# Patient Record
Sex: Female | Born: 1955 | Hispanic: Yes | Marital: Single | Smoking: Former smoker
Health system: Southern US, Community
[De-identification: ages and names within clinical notes are randomized; demographics above are authoritative.]

---

## 2017-11-17 DIAGNOSIS — J4540 Moderate persistent asthma, uncomplicated: Secondary | ICD-10-CM

## 2017-11-17 HISTORY — DX: Moderate persistent asthma, uncomplicated: J45.40

## 2017-12-21 ENCOUNTER — Other Ambulatory Visit: Payer: Self-pay | Admitting: Nurse Practitioner

## 2017-12-21 DIAGNOSIS — Z1231 Encounter for screening mammogram for malignant neoplasm of breast: Secondary | ICD-10-CM

## 2019-03-07 DIAGNOSIS — E559 Vitamin D deficiency, unspecified: Secondary | ICD-10-CM

## 2019-03-07 HISTORY — DX: Vitamin D deficiency, unspecified: E55.9

## 2022-03-30 ENCOUNTER — Emergency Department (HOSPITAL_COMMUNITY): Payer: BLUE CROSS/BLUE SHIELD

## 2022-03-30 ENCOUNTER — Other Ambulatory Visit: Payer: Self-pay

## 2022-03-30 ENCOUNTER — Encounter (HOSPITAL_COMMUNITY): Payer: Self-pay

## 2022-03-30 ENCOUNTER — Observation Stay (HOSPITAL_COMMUNITY): Payer: BLUE CROSS/BLUE SHIELD

## 2022-03-30 ENCOUNTER — Observation Stay (HOSPITAL_COMMUNITY)
Admission: EM | Admit: 2022-03-30 | Discharge: 2022-03-31 | Disposition: A | Discharge status: Home / Self Care | Payer: BLUE CROSS/BLUE SHIELD | Attending: Internal Medicine | Admitting: Internal Medicine

## 2022-03-30 DIAGNOSIS — K922 Gastrointestinal hemorrhage, unspecified: Secondary | ICD-10-CM | POA: Diagnosis not present

## 2022-03-30 DIAGNOSIS — J4540 Moderate persistent asthma, uncomplicated: Secondary | ICD-10-CM

## 2022-03-30 DIAGNOSIS — D62 Acute posthemorrhagic anemia: Secondary | ICD-10-CM | POA: Diagnosis not present

## 2022-03-30 DIAGNOSIS — K5731 Diverticulosis of large intestine without perforation or abscess with bleeding: Principal | ICD-10-CM | POA: Insufficient documentation

## 2022-03-30 DIAGNOSIS — Z87891 Personal history of nicotine dependence: Secondary | ICD-10-CM | POA: Diagnosis not present

## 2022-03-30 DIAGNOSIS — Z7984 Long term (current) use of oral hypoglycemic drugs: Secondary | ICD-10-CM | POA: Diagnosis not present

## 2022-03-30 DIAGNOSIS — Z794 Long term (current) use of insulin: Secondary | ICD-10-CM

## 2022-03-30 DIAGNOSIS — K648 Other hemorrhoids: Secondary | ICD-10-CM | POA: Diagnosis not present

## 2022-03-30 DIAGNOSIS — E1165 Type 2 diabetes mellitus with hyperglycemia: Secondary | ICD-10-CM

## 2022-03-30 DIAGNOSIS — Z79899 Other long term (current) drug therapy: Secondary | ICD-10-CM | POA: Insufficient documentation

## 2022-03-30 DIAGNOSIS — K921 Melena: Secondary | ICD-10-CM | POA: Diagnosis not present

## 2022-03-30 DIAGNOSIS — E782 Mixed hyperlipidemia: Secondary | ICD-10-CM | POA: Diagnosis not present

## 2022-03-30 DIAGNOSIS — E1169 Type 2 diabetes mellitus with other specified complication: Secondary | ICD-10-CM | POA: Diagnosis not present

## 2022-03-30 DIAGNOSIS — I10 Essential (primary) hypertension: Secondary | ICD-10-CM | POA: Diagnosis present

## 2022-03-30 DIAGNOSIS — K625 Hemorrhage of anus and rectum: Secondary | ICD-10-CM | POA: Diagnosis present

## 2022-03-30 HISTORY — DX: Essential (primary) hypertension: I10

## 2022-03-30 HISTORY — DX: Type 2 diabetes mellitus with hyperglycemia: E11.65

## 2022-03-30 HISTORY — DX: Type 2 diabetes mellitus with other specified complication: E11.69

## 2022-03-30 HISTORY — DX: Mixed hyperlipidemia: E78.2

## 2022-03-30 LAB — COMPREHENSIVE METABOLIC PANEL
ALT: 23 U/L (ref 0–44)
AST: 20 U/L (ref 15–41)
Albumin: 4.4 g/dL (ref 3.5–5.0)
Alkaline Phosphatase: 52 U/L (ref 38–126)
Anion gap: 9 (ref 5–15)
BUN: 17 mg/dL (ref 8–23)
CO2: 26 mmol/L (ref 22–32)
Calcium: 9.4 mg/dL (ref 8.9–10.3)
Chloride: 106 mmol/L (ref 98–111)
Creatinine, Ser: 0.73 mg/dL (ref 0.44–1.00)
GFR, Estimated: >60 mL/min (ref >60)
Glucose, Bld: 134 mg/dL — ABNORMAL HIGH (ref 70–99)
Potassium: 3.9 mmol/L (ref 3.5–5.1)
Sodium: 141 mmol/L (ref 135–145)
Total Bilirubin: 0.3 mg/dL (ref 0.3–1.2)
Total Protein: 7.7 g/dL (ref 6.5–8.1)

## 2022-03-30 LAB — CBC WITH DIFFERENTIAL/PLATELET
Abs Immature Granulocytes: 0.02 10*3/uL (ref 0.00–0.07)
Basophils Absolute: 0 10*3/uL (ref 0.0–0.1)
Basophils Relative: 0 %
Eosinophils Absolute: 0.2 10*3/uL (ref 0.0–0.5)
Eosinophils Relative: 2 %
HCT: 45.8 % (ref 36.0–46.0)
Hemoglobin: 14.7 g/dL (ref 12.0–15.0)
Immature Granulocytes: 0 %
Lymphocytes Relative: 26 %
Lymphs Abs: 2.5 10*3/uL (ref 0.7–4.0)
MCH: 27.1 pg (ref 26.0–34.0)
MCHC: 32.1 g/dL (ref 30.0–36.0)
MCV: 84.5 fL (ref 80.0–100.0)
Monocytes Absolute: 0.6 10*3/uL (ref 0.1–1.0)
Monocytes Relative: 6 %
Neutro Abs: 6.4 10*3/uL (ref 1.7–7.7)
Neutrophils Relative %: 66 %
Platelets: 296 10*3/uL (ref 150–400)
RBC: 5.42 MIL/uL — ABNORMAL HIGH (ref 3.87–5.11)
RDW: 13.2 % (ref 11.5–15.5)
WBC: 9.8 10*3/uL (ref 4.0–10.5)
nRBC: 0 % (ref 0.0–0.2)

## 2022-03-30 LAB — CBC
HCT: 38.4 % (ref 36.0–46.0)
HCT: 37.7 % (ref 36.0–46.0)
HCT: 35.7 % — ABNORMAL LOW (ref 36.0–46.0)
Hemoglobin: 12.4 g/dL (ref 12.0–15.0)
Hemoglobin: 12 g/dL (ref 12.0–15.0)
Hemoglobin: 11.4 g/dL — ABNORMAL LOW (ref 12.0–15.0)
MCH: 27.2 pg (ref 26.0–34.0)
MCH: 27 pg (ref 26.0–34.0)
MCH: 27.1 pg (ref 26.0–34.0)
MCHC: 32.3 g/dL (ref 30.0–36.0)
MCHC: 31.8 g/dL (ref 30.0–36.0)
MCHC: 31.9 g/dL (ref 30.0–36.0)
MCV: 84.2 fL (ref 80.0–100.0)
MCV: 84.9 fL (ref 80.0–100.0)
MCV: 85 fL (ref 80.0–100.0)
Platelets: 283 10*3/uL (ref 150–400)
Platelets: 262 10*3/uL (ref 150–400)
Platelets: 235 10*3/uL (ref 150–400)
RBC: 4.56 MIL/uL (ref 3.87–5.11)
RBC: 4.44 MIL/uL (ref 3.87–5.11)
RBC: 4.2 MIL/uL (ref 3.87–5.11)
RDW: 13.3 % (ref 11.5–15.5)
RDW: 13.3 % (ref 11.5–15.5)
RDW: 13.3 % (ref 11.5–15.5)
WBC: 7.7 10*3/uL (ref 4.0–10.5)
WBC: 6.7 10*3/uL (ref 4.0–10.5)
WBC: 6.5 10*3/uL (ref 4.0–10.5)
nRBC: 0 % (ref 0.0–0.2)
nRBC: 0 % (ref 0.0–0.2)
nRBC: 0 % (ref 0.0–0.2)

## 2022-03-30 LAB — GLUCOSE, CAPILLARY
Glucose-Capillary: 121 mg/dL — ABNORMAL HIGH (ref 70–99)
Glucose-Capillary: 65 mg/dL — ABNORMAL LOW (ref 70–99)
Glucose-Capillary: 168 mg/dL — ABNORMAL HIGH (ref 70–99)

## 2022-03-30 LAB — CBG MONITORING, ED
Glucose-Capillary: 111 mg/dL — ABNORMAL HIGH (ref 70–99)
Glucose-Capillary: 164 mg/dL — ABNORMAL HIGH (ref 70–99)

## 2022-03-30 LAB — POC OCCULT BLOOD, ED: Fecal Occult Bld: POSITIVE — AB

## 2022-03-30 LAB — TYPE AND SCREEN
ABO/RH(D): AB POS
Antibody Screen: NEGATIVE

## 2022-03-30 LAB — PROTIME-INR
INR: 0.9 (ref 0.8–1.2)
Prothrombin Time: 12.2 seconds (ref 11.4–15.2)

## 2022-03-30 LAB — HEMOGLOBIN A1C
Hgb A1c MFr Bld: 8.7 % — ABNORMAL HIGH (ref 4.8–5.6)
Mean Plasma Glucose: 202.99 mg/dL

## 2022-03-30 LAB — HIV ANTIBODY (ROUTINE TESTING W REFLEX): HIV Screen 4th Generation wRfx: NONREACTIVE

## 2022-03-30 LAB — ABO/RH: ABO/RH(D): AB POS

## 2022-03-30 MED ORDER — INSULIN GLARGINE-YFGN 100 UNIT/ML ~~LOC~~ SOLN
40.0000 [IU] | Freq: Every day | SUBCUTANEOUS | Status: DC
  Filled 2022-03-30: qty 0.4

## 2022-03-30 MED ORDER — HYDRALAZINE HCL 20 MG/ML IJ SOLN: 10.0000 mg | Freq: Four times a day (QID) | INTRAMUSCULAR | Status: DC | PRN

## 2022-03-30 MED ORDER — PANTOPRAZOLE INFUSION (NEW) - SIMPLE MED
8.0000 mg/h | INTRAVENOUS | Status: DC
  Administered 2022-03-30: 8 mg/h via INTRAVENOUS
  Filled 2022-03-30: qty 80

## 2022-03-30 MED ORDER — PEG-KCL-NACL-NASULF-NA ASC-C 100 G PO SOLR
0.5000 | Freq: Once | ORAL | Status: AC
  Administered 2022-03-30: 100 g via ORAL
  Filled 2022-03-30 (×2): qty 1

## 2022-03-30 MED ORDER — ACETAMINOPHEN 325 MG PO TABS: 650.0000 mg | ORAL_TABLET | Freq: Four times a day (QID) | ORAL | Status: DC | PRN

## 2022-03-30 MED ORDER — INSULIN ASPART 100 UNIT/ML IJ SOLN
0.0000 [IU] | Freq: Three times a day (TID) | INTRAMUSCULAR | Status: DC
  Administered 2022-03-30 (×2): 3 [IU] via SUBCUTANEOUS
  Filled 2022-03-30: qty 0.15

## 2022-03-30 MED ORDER — ONDANSETRON HCL 4 MG PO TABS: 4.0000 mg | ORAL_TABLET | Freq: Four times a day (QID) | ORAL | Status: DC | PRN

## 2022-03-30 MED ORDER — PANTOPRAZOLE SODIUM 40 MG IV SOLR: 40.0000 mg | Freq: Two times a day (BID) | INTRAVENOUS | Status: DC

## 2022-03-30 MED ORDER — LACTATED RINGERS IV SOLN: INTRAVENOUS | Status: AC

## 2022-03-30 MED ORDER — PEG-KCL-NACL-NASULF-NA ASC-C 100 G PO SOLR: 1.0000 | Freq: Once | ORAL | Status: DC

## 2022-03-30 MED ORDER — ALBUTEROL SULFATE (2.5 MG/3ML) 0.083% IN NEBU
2.5000 mg | INHALATION_SOLUTION | RESPIRATORY_TRACT | Status: DC | PRN
  Administered 2022-03-31 (×2): 2.5 mg via RESPIRATORY_TRACT
  Filled 2022-03-30 (×2): qty 3

## 2022-03-30 MED ORDER — PANTOPRAZOLE 80MG IVPB - SIMPLE MED
80.0000 mg | Freq: Once | INTRAVENOUS | Status: AC
  Administered 2022-03-30: 80 mg via INTRAVENOUS
  Filled 2022-03-30: qty 80

## 2022-03-30 MED ORDER — ACETAMINOPHEN 650 MG RE SUPP: 650.0000 mg | Freq: Four times a day (QID) | RECTAL | Status: DC | PRN

## 2022-03-30 MED ORDER — SODIUM CHLORIDE (PF) 0.9 % IJ SOLN
INTRAMUSCULAR | Status: AC
  Filled 2022-03-30: qty 50

## 2022-03-30 MED ORDER — PEG-KCL-NACL-NASULF-NA ASC-C 100 G PO SOLR
0.5000 | Freq: Once | ORAL | Status: AC
  Administered 2022-03-31: 100 g via ORAL
  Filled 2022-03-30: qty 1

## 2022-03-30 MED ORDER — PANTOPRAZOLE SODIUM 40 MG IV SOLR
40.0000 mg | Freq: Every day | INTRAVENOUS | Status: DC
  Administered 2022-03-30: 40 mg via INTRAVENOUS
  Filled 2022-03-30: qty 10

## 2022-03-30 MED ORDER — ONDANSETRON HCL 4 MG/2ML IJ SOLN: 4.0000 mg | Freq: Four times a day (QID) | INTRAMUSCULAR | Status: DC | PRN

## 2022-03-30 MED ORDER — POLYETHYLENE GLYCOL 3350 17 G PO PACK: 17.0000 g | PACK | Freq: Every day | ORAL | Status: DC | PRN

## 2022-03-30 MED ORDER — IOHEXOL 300 MG/ML  SOLN: 100.0000 mL | Freq: Once | INTRAMUSCULAR | Status: DC | PRN

## 2022-03-30 MED ORDER — INSULIN DEGLUDEC 100 UNIT/ML ~~LOC~~ SOPN: 40.0000 [IU] | PEN_INJECTOR | Freq: Every day | SUBCUTANEOUS | Status: DC

## 2022-03-30 MED ORDER — ROSUVASTATIN CALCIUM 20 MG PO TABS
40.0000 mg | ORAL_TABLET | Freq: Every day | ORAL | Status: DC
  Administered 2022-03-30 – 2022-03-31 (×2): 40 mg via ORAL
  Filled 2022-03-30 (×2): qty 2

## 2022-03-30 MED ORDER — SODIUM CHLORIDE 0.9% FLUSH
3.0000 mL | Freq: Two times a day (BID) | INTRAVENOUS | Status: DC
  Administered 2022-03-31: 3 mL via INTRAVENOUS

## 2022-03-30 NOTE — ED Notes (Signed)
BGS 111   no insulin given  patient states no blood in stool but there is a small amount of blood when she wipes

## 2022-03-30 NOTE — Assessment & Plan Note (Addendum)
   Patient presenting with approximately 5-6 episodes of bloody stool prompting her presentation  No associated pain suggesting a diverticular bleed  Alternatively this could simply be bleeding internal hemorrhoid considering the patient began to bleed shortly after inserting a suppository  Hemodynamically stable, hemoglobin currently stable  Performing serial CBCs  Gentle intravenous hydration  Obtaining gastroenterology consultation to decide on whether endoscopic work-up (after prep could possibly be done 6/22) versus CTA of the abdomen (with steroid prep) would be the next diagnostic step  ER provider initiated an intravenous PPI although I think it is unlikely that this is upper in origin.

## 2022-03-30 NOTE — ED Triage Notes (Signed)
Pt reports with rectal bleeding since 8 pm last night. Pt states that she was initially constipated, took a suppository and was then having the bleeding last night.

## 2022-03-30 NOTE — Consult Note (Signed)
Referring Provider: [facility] Primary Care Physician:  Bernita Buffy Primary Gastroenterologist:  Gentry Fitz  Reason for Consultation: GI bleed  HPI: Susan Yates is a 66 y.o. female medical history significant for hypertension, asthma, diabetes, presents for evaluation of GI bleed.  Patient states yesterday she had multiple episodes of rectal bleeding, appeared as.  Blood with red blood clots.  States her last episode of rectal bleeding was at 5 AM this morning.  Has had no further bleeding since. Denies melena. Denies nausea/vomiting. No previous history of rectal bleeding. Denies weight loss. Denies family history of colon cancer.  Reports chronic epigastric pain, worse with eating. This has been evaluated by primary GI with EGD (unable to view report) but patient reports EGD was normal.  Last colonoscopy 01/2018 done with [facility], colonoscopy report unavailable.  Per patient report 1 polyp was found and a recall of 5 years  EGD 05/2020 done for LUQ pain and history of H. Pylori.  Serum H. pylori 03/25/2020: Positive (treated with amoxicillin, Biaxin).  Unable to review EGD report                                                                                                                           Past Medical History:  Diagnosis Date   Essential hypertension 03/30/2022   Mixed diabetic hyperlipidemia associated with type 2 diabetes mellitus (HCC) 03/30/2022   Moderate persistent asthma without complication 11/17/2017   Last Assessment & Plan:  Formatting of this note might be different from the original. Well controlled on current treatment. No changes.   Uncontrolled type 2 diabetes mellitus with hyperglycemia, with long-term current use of insulin (HCC) 03/30/2022   Vitamin D deficiency 03/07/2019    History reviewed. No pertinent surgical history.  Prior to Admission medications   Medication Sig Start Date End Date Taking? Authorizing Provider  FARXIGA 10 MG TABS tablet Take 10  mg by mouth daily. 02/02/22  Yes [provider]  glipiZIDE (GLUCOTROL) 10 MG tablet Take 10 mg by mouth at bedtime. 02/02/22  Yes [provider]  insulin aspart (NOVOLOG) 100 UNIT/ML injection Inject 10 Units into the skin 3 (three) times daily before meals.   Yes [provider]  Insulin Degludec (TRESIBA FLEXTOUCH [place]) Inject 40 Units into the skin at bedtime.   Yes [provider]  rosuvastatin (CRESTOR) 40 MG tablet Take 40 mg by mouth daily. 02/02/22  Yes [provider]  Semaglutide, 2 MG/DOSE, (OZEMPIC, 2 MG/DOSE,) 8 MG/3ML SOPN Inject 2 mg into the skin once a week.   Yes [provider]  terbinafine (LAMISIL) 250 MG tablet Take 250 mg by mouth daily. 02/01/22  Yes [provider]  VENTOLIN HFA 108 (90 Base) MCG/ACT inhaler Inhale 2 puffs into the lungs every 6 (six) hours as needed for shortness of breath or wheezing. 02/02/22  Yes [provider]  Vitamin D, Ergocalciferol, (DRISDOL) 1.25 MG (50000 UNIT) CAPS capsule Take 50,000 Units by mouth once  a week. 02/02/22  Yes [provider]    Scheduled Meds:  insulin aspart  0-15 Units Subcutaneous TID AC & HS   insulin glargine-yfgn  40 Units Subcutaneous QHS   [START ON 04/02/2022] pantoprazole  40 mg Intravenous Q12H   rosuvastatin  40 mg Oral Daily   sodium chloride (PF)       sodium chloride flush  3 mL Intravenous Q12H   Continuous Infusions:  lactated ringers 100 mL/hr at 03/30/22 0649   PRN Meds:.acetaminophen **OR** acetaminophen, albuterol, ondansetron **OR** ondansetron (ZOFRAN) IV, polyethylene glycol, sodium chloride (PF)  Allergies as of 03/30/2022 - Review Complete 03/30/2022  Allergen Reaction Noted   Iohexol Hives and Itching 03/30/2022    Family History  Problem Relation Age of Onset   Heart disease Neg Hx     Social History   Socioeconomic History   Marital status: Single    Spouse name: Not on file   Number of children: Not on  file   Years of education: Not on file   Highest education level: Not on file  Occupational History   Not on file  Tobacco Use   Smoking status: Former    Types: Cigarettes   Smokeless tobacco: Never  Substance and Sexual Activity   Alcohol use: Not on file   Drug use: Not on file   Sexual activity: Not on file  Other Topics Concern   Not on file  Social History Narrative   Not on file   Social Determinants of Health   Financial Resource Strain: Not on file  Food Insecurity: Not on file  Transportation Needs: Not on file  Physical Activity: Not on file  Stress: Not on file  Social Connections: Not on file  Intimate Partner Violence: Not on file    Review of Systems: Review of Systems  Constitutional:  Negative for chills, fever and weight loss.  HENT:  Negative for hearing loss and tinnitus.   Eyes:  Negative for blurred vision and double vision.  Respiratory:  Negative for cough and hemoptysis.   Cardiovascular:  Negative for chest pain and palpitations.  Gastrointestinal:  Positive for abdominal pain and blood in stool. Negative for constipation, diarrhea, heartburn, melena, nausea and vomiting.  Genitourinary:  Negative for dysuria and urgency.  Musculoskeletal:  Negative for myalgias and neck pain.  Skin:  Negative for itching and rash.  Neurological:  Negative for seizures and loss of consciousness.  Psychiatric/Behavioral:  Negative for depression and suicidal ideas.      Physical Exam:Physical Exam Constitutional:      Appearance: Normal appearance. She is normal weight.  HENT:     Head: Normocephalic and atraumatic.     Nose: Nose normal. No congestion.     Mouth/Throat:     Mouth: Mucous membranes are moist.     Pharynx: Oropharynx is clear.  Eyes:     Extraocular Movements: Extraocular movements intact.     Conjunctiva/sclera: Conjunctivae normal.  Cardiovascular:     Rate and Rhythm: Normal rate and regular rhythm.  Pulmonary:     Effort: Pulmonary  effort is normal. No respiratory distress.  Abdominal:     General: Abdomen is flat. Bowel sounds are normal. There is no distension.     Palpations: Abdomen is soft. There is no mass.     Tenderness: There is no abdominal tenderness. There is no guarding or rebound.     Hernia: No hernia is present.  Musculoskeletal:        General:  No swelling. Normal range of motion.     Cervical back: Normal range of motion and neck supple.  Skin:    General: Skin is warm and dry.  Neurological:     General: No focal deficit present.     Mental Status: She is oriented to person, place, and time.  Psychiatric:        Mood and Affect: Mood normal.        Behavior: Behavior normal.        Thought Content: Thought content normal.        Judgment: Judgment normal.     Vital signs: Vitals:   03/30/22 0630 03/30/22 0700  BP: 114/78 115/75  Pulse: 73 77  Resp: 18 20  Temp:    SpO2: 97% 98%        GI:  Lab Results: Recent Labs    03/30/22 0328  WBC 9.8  HGB 14.7  HCT 45.8  PLT 296   BMET Recent Labs    03/30/22 0328  NA 141  K 3.9  CL 106  CO2 26  GLUCOSE 134*  BUN 17  CREATININE 0.73  CALCIUM 9.4   LFT Recent Labs    03/30/22 0328  PROT 7.7  ALBUMIN 4.4  AST 20  ALT 23  ALKPHOS 52  BILITOT 0.3   PT/INR Recent Labs    03/30/22 0328  LABPROT 12.2  INR 0.9     Studies/Results: No results found.  Impression: Lower GI bleed -Hgb 14.7 -BUN 17, creatinine 0.73 -Positive fecal occult  Plan: Plan for Colonoscopy tomorrow. I thoroughly discussed the procedures to include nature, alternatives, benefits, and risks including but not limited to bleeding, perforation, infection, anesthesia/cardiac and pulmonary complications. Patient provides understanding and gave verbal consent to proceed. Continue Protonix 40 mg IV BID. Movi prep. First bottle 10pm-12am, second bottle 4am to 8am with NPO 4 hrs prior to procedure. Continue daily CBC with transfusion as needed  to maintain Hgb >7.  [facility] will follow.       LOS: 0 days   Keyarra Rendall M Castle Lamons  PA-C 03/30/2022, 8:22 AM  Contact #  [phone]  

## 2022-03-30 NOTE — Assessment & Plan Note (Addendum)
.   Patient been placed on Accu-Cheks before every meal and nightly with sliding scale insulin . Due to tenuous oral intake we will hold off on basal insulin therapy for now . Holding home regimen of hypoglycemics . Hemoglobin A1C ordered

## 2022-03-30 NOTE — H&P (View-Only) (Signed)
Referring Provider: [facility] Primary Care Physician:  Bernita Buffy Primary Gastroenterologist:  Gentry Fitz  Reason for Consultation: GI bleed  HPI: Susan Yates is a 66 y.o. female medical history significant for hypertension, asthma, diabetes, presents for evaluation of GI bleed.  Patient states yesterday she had multiple episodes of rectal bleeding, appeared as.  Blood with red blood clots.  States her last episode of rectal bleeding was at 5 AM this morning.  Has had no further bleeding since. Denies melena. Denies nausea/vomiting. No previous history of rectal bleeding. Denies weight loss. Denies family history of colon cancer.  Reports chronic epigastric pain, worse with eating. This has been evaluated by primary GI with EGD (unable to view report) but patient reports EGD was normal.  Last colonoscopy 01/2018 done with [facility], colonoscopy report unavailable.  Per patient report 1 polyp was found and a recall of 5 years  EGD 05/2020 done for LUQ pain and history of H. Pylori.  Serum H. pylori 03/25/2020: Positive (treated with amoxicillin, Biaxin).  Unable to review EGD report                                                                                                                           Past Medical History:  Diagnosis Date   Essential hypertension 03/30/2022   Mixed diabetic hyperlipidemia associated with type 2 diabetes mellitus (HCC) 03/30/2022   Moderate persistent asthma without complication 11/17/2017   Last Assessment & Plan:  Formatting of this note might be different from the original. Well controlled on current treatment. No changes.   Uncontrolled type 2 diabetes mellitus with hyperglycemia, with long-term current use of insulin (HCC) 03/30/2022   Vitamin D deficiency 03/07/2019    History reviewed. No pertinent surgical history.  Prior to Admission medications   Medication Sig Start Date End Date Taking? Authorizing Provider  FARXIGA 10 MG TABS tablet Take 10  mg by mouth daily. 02/02/22  Yes [provider]  glipiZIDE (GLUCOTROL) 10 MG tablet Take 10 mg by mouth at bedtime. 02/02/22  Yes [provider]  insulin aspart (NOVOLOG) 100 UNIT/ML injection Inject 10 Units into the skin 3 (three) times daily before meals.   Yes [provider]  Insulin Degludec (TRESIBA FLEXTOUCH [place]) Inject 40 Units into the skin at bedtime.   Yes [provider]  rosuvastatin (CRESTOR) 40 MG tablet Take 40 mg by mouth daily. 02/02/22  Yes [provider]  Semaglutide, 2 MG/DOSE, (OZEMPIC, 2 MG/DOSE,) 8 MG/3ML SOPN Inject 2 mg into the skin once a week.   Yes [provider]  terbinafine (LAMISIL) 250 MG tablet Take 250 mg by mouth daily. 02/01/22  Yes [provider]  VENTOLIN HFA 108 (90 Base) MCG/ACT inhaler Inhale 2 puffs into the lungs every 6 (six) hours as needed for shortness of breath or wheezing. 02/02/22  Yes [provider]  Vitamin D, Ergocalciferol, (DRISDOL) 1.25 MG (50000 UNIT) CAPS capsule Take 50,000 Units by mouth once  a week. 02/02/22  Yes [provider]    Scheduled Meds:  insulin aspart  0-15 Units Subcutaneous TID AC & HS   insulin glargine-yfgn  40 Units Subcutaneous QHS   [START ON 04/02/2022] pantoprazole  40 mg Intravenous Q12H   rosuvastatin  40 mg Oral Daily   sodium chloride (PF)       sodium chloride flush  3 mL Intravenous Q12H   Continuous Infusions:  lactated ringers 100 mL/hr at 03/30/22 0649   PRN Meds:.acetaminophen **OR** acetaminophen, albuterol, ondansetron **OR** ondansetron (ZOFRAN) IV, polyethylene glycol, sodium chloride (PF)  Allergies as of 03/30/2022 - Review Complete 03/30/2022  Allergen Reaction Noted   Iohexol Hives and Itching 03/30/2022    Family History  Problem Relation Age of Onset   Heart disease Neg Hx     Social History   Socioeconomic History   Marital status: Single    Spouse name: Not on file   Number of children: Not on  file   Years of education: Not on file   Highest education level: Not on file  Occupational History   Not on file  Tobacco Use   Smoking status: Former    Types: Cigarettes   Smokeless tobacco: Never  Substance and Sexual Activity   Alcohol use: Not on file   Drug use: Not on file   Sexual activity: Not on file  Other Topics Concern   Not on file  Social History Narrative   Not on file   Social Determinants of Health   Financial Resource Strain: Not on file  Food Insecurity: Not on file  Transportation Needs: Not on file  Physical Activity: Not on file  Stress: Not on file  Social Connections: Not on file  Intimate Partner Violence: Not on file    Review of Systems: Review of Systems  Constitutional:  Negative for chills, fever and weight loss.  HENT:  Negative for hearing loss and tinnitus.   Eyes:  Negative for blurred vision and double vision.  Respiratory:  Negative for cough and hemoptysis.   Cardiovascular:  Negative for chest pain and palpitations.  Gastrointestinal:  Positive for abdominal pain and blood in stool. Negative for constipation, diarrhea, heartburn, melena, nausea and vomiting.  Genitourinary:  Negative for dysuria and urgency.  Musculoskeletal:  Negative for myalgias and neck pain.  Skin:  Negative for itching and rash.  Neurological:  Negative for seizures and loss of consciousness.  Psychiatric/Behavioral:  Negative for depression and suicidal ideas.      Physical Exam:Physical Exam Constitutional:      Appearance: Normal appearance. She is normal weight.  HENT:     Head: Normocephalic and atraumatic.     Nose: Nose normal. No congestion.     Mouth/Throat:     Mouth: Mucous membranes are moist.     Pharynx: Oropharynx is clear.  Eyes:     Extraocular Movements: Extraocular movements intact.     Conjunctiva/sclera: Conjunctivae normal.  Cardiovascular:     Rate and Rhythm: Normal rate and regular rhythm.  Pulmonary:     Effort: Pulmonary  effort is normal. No respiratory distress.  Abdominal:     General: Abdomen is flat. Bowel sounds are normal. There is no distension.     Palpations: Abdomen is soft. There is no mass.     Tenderness: There is no abdominal tenderness. There is no guarding or rebound.     Hernia: No hernia is present.  Musculoskeletal:        General:  No swelling. Normal range of motion.     Cervical back: Normal range of motion and neck supple.  Skin:    General: Skin is warm and dry.  Neurological:     General: No focal deficit present.     Mental Status: She is oriented to person, place, and time.  Psychiatric:        Mood and Affect: Mood normal.        Behavior: Behavior normal.        Thought Content: Thought content normal.        Judgment: Judgment normal.     Vital signs: Vitals:   03/30/22 0630 03/30/22 0700  BP: 114/78 115/75  Pulse: 73 77  Resp: 18 20  Temp:    SpO2: 97% 98%        GI:  Lab Results: Recent Labs    03/30/22 0328  WBC 9.8  HGB 14.7  HCT 45.8  PLT 296   BMET Recent Labs    03/30/22 0328  NA 141  K 3.9  CL 106  CO2 26  GLUCOSE 134*  BUN 17  CREATININE 0.73  CALCIUM 9.4   LFT Recent Labs    03/30/22 0328  PROT 7.7  ALBUMIN 4.4  AST 20  ALT 23  ALKPHOS 52  BILITOT 0.3   PT/INR Recent Labs    03/30/22 0328  LABPROT 12.2  INR 0.9     Studies/Results: No results found.  Impression: Lower GI bleed -Hgb 14.7 -BUN 17, creatinine 0.73 -Positive fecal occult  Plan: Plan for Colonoscopy tomorrow. I thoroughly discussed the procedures to include nature, alternatives, benefits, and risks including but not limited to bleeding, perforation, infection, anesthesia/cardiac and pulmonary complications. Patient provides understanding and gave verbal consent to proceed. Continue Protonix 40 mg IV BID. Movi prep. First bottle 10pm-12am, second bottle 4am to 8am with NPO 4 hrs prior to procedure. Continue daily CBC with transfusion as needed  to maintain Hgb >7.  [facility] will follow.       LOS: 0 days   Tenzin Pavon Radford Pax  PA-C 03/30/2022, 8:22 AM  Contact #  [phone]

## 2022-03-30 NOTE — Assessment & Plan Note (Signed)
.   Continuing home regimen of lipid lowering therapy.  

## 2022-03-30 NOTE — Assessment & Plan Note (Signed)
   Documented history of hypertension although there are no antihypertensives on the med list  Currently normotensive  PRN IV antihypertensives for elevated BP.

## 2022-03-30 NOTE — H&P (Signed)
History and Physical    Patient: Susan Yates MRN: [id number] DOA: 03/30/2022  Date of Service: the patient was seen and examined on 03/30/2022  Patient coming from: Home  Chief Complaint:  Chief Complaint  Patient presents with   Rectal Bleeding    HPI:   66 year old female with past medical history of hypertension, hyperlipidemia, insulin-dependent diabetes mellitus type 2 who presents to [facility] emergency department with complaints of bloody stool.  Patient explains that throughout the day the day before presentation she was feeling constipated and bloated.  Patient claims that she took a suppository earlier in the day to try and move her bowels.  By approximately 8 PM that evening she began to exhibit multiple bloody bowel movements.  Stool appeared black and tarry in appearance.  Patient denies any overt abdominal pain.  Patient denies any fever, sick contacts, recent antibiotic use or recent ingestion of undercooked food.  The following day due to continued episodes of dark bloody stools patient presented to [facility] emergency department for evaluation.  Upon evaluation in the emergency department patient exhibited a moderate volume bowel movement mixed with a significant amount of dark bloody stool.  Due to concerns for gastrointestinal bleeding ER provider initiated intravenous Protonix.  ER provider also sent a secure chat message to [facility] gastroenterology requesting nonurgent GI consultation.  The hospitalist group was then called to assess the patient for admission to the hospital.  Review of Systems: Review of Systems  Gastrointestinal:  Positive for blood in stool and melena.     Past Medical History:  Diagnosis Date   Essential hypertension 03/30/2022   Mixed diabetic hyperlipidemia associated with type 2 diabetes mellitus ([place]) 03/30/2022   Moderate persistent asthma without complication [id number]   Last Assessment & Plan:  Formatting of this  note might be different from the original. Well controlled on current treatment. No changes.   Uncontrolled type 2 diabetes mellitus with hyperglycemia, with long-term current use of insulin ([place]) 03/30/2022   Vitamin D deficiency 03/07/2019    History reviewed. No pertinent surgical history.  Social History:  reports that she has quit smoking. Her smoking use included cigarettes. She has never used smokeless tobacco. No history on file for alcohol use and drug use.  Allergies  Allergen Reactions   Iohexol Hives and Itching    Developed hives after getting contrast for a CT scan done in [place] 4-5 yrs ago, gets pre-medicated before scans since then    Family History  Problem Relation Age of Onset   Heart disease Neg Hx     Prior to Admission medications   Medication Sig Start Date End Date Taking? Authorizing Provider  FARXIGA 10 MG TABS tablet Take 10 mg by mouth daily. 02/02/22  Yes [provider]  glipiZIDE (GLUCOTROL) 10 MG tablet Take 10 mg by mouth at bedtime. 02/02/22  Yes [provider]  insulin aspart (NOVOLOG) 100 UNIT/ML injection Inject 10 Units into the skin 3 (three) times daily before meals.   Yes [provider]  Insulin Degludec (TRESIBA FLEXTOUCH ) Inject 40 Units into the skin at bedtime.   Yes [provider]  rosuvastatin (CRESTOR) 40 MG tablet Take 40 mg by mouth daily. 02/02/22  Yes [provider]  Semaglutide, 2 MG/DOSE, (OZEMPIC, 2 MG/DOSE,) 8 MG/3ML SOPN Inject 2 mg into the skin once a week.   Yes [provider]  terbinafine (LAMISIL) 250 MG tablet Take 250 mg by mouth daily. 02/01/22  Yes [provider]  VENTOLIN HFA 108 (90 Base) MCG/ACT inhaler Inhale 2 puffs into the lungs every 6 (six) hours as needed for shortness of breath or wheezing. 02/02/22  Yes [provider]  Vitamin D, Ergocalciferol, (DRISDOL) 1.25 MG (50000 UNIT) CAPS capsule Take 50,000 Units by mouth once a week.  02/02/22  Yes [provider]    Physical Exam:  Vitals:   03/30/22 0630 03/30/22 0700 03/30/22 0830 03/30/22 0900  BP: 114/78 115/75 114/74 105/64  Pulse: 73 77 71 81  Resp: 18 20  16   Temp:      TempSrc:      SpO2: 97% 98% 98% 99%  Weight:      Height:        Constitutional: Awake alert and oriented x3, no associated distress.   Skin: no rashes, no lesions, good skin turgor noted. Eyes: Pupils are equally reactive to light.  No evidence of scleral icterus or conjunctival pallor.  ENMT: Moist mucous membranes noted.  Posterior pharynx clear of any exudate or lesions.   Neck: normal, supple, no masses, no thyromegaly.  No evidence of jugular venous distension.   Respiratory: clear to auscultation bilaterally, no wheezing, no crackles. Normal respiratory effort. No accessory muscle use.  Cardiovascular: Regular rate and rhythm, no murmurs / rubs / gallops. No extremity edema. 2+ pedal pulses. No carotid bruits.  Chest:   Nontender without crepitus or deformity.   Back:   Nontender without crepitus or deformity. Abdomen: Abdomen is soft and nontender.  No evidence of intra-abdominal masses.  Positive bowel sounds noted in all quadrants.   Musculoskeletal: No joint deformity upper and lower extremities. Good ROM, no contractures. Normal muscle tone.  Neurologic: CN 2-12 grossly intact. Sensation intact.  Patient moving all 4 extremities spontaneously.  Patient is following all commands.  Patient is responsive to verbal stimuli.   Psychiatric: Patient exhibits normal mood with appropriate affect.  Patient seems to possess insight as to their current situation.    Data Reviewed:  I have personally reviewed and interpreted labs, imaging.  Significant findings are:  Lab Results  Component Value Date   WBC 7.7 03/30/2022   HGB 12.4 03/30/2022   HCT 38.4 03/30/2022   MCV 84.2 03/30/2022   PLT 283 03/30/2022     Telemetry: Personally reviewed.  Rhythm is  NSR.    Assessment and Plan: * Acute GI bleeding Patient presenting with approximately 5-6 episodes of bloody stool prompting her presentation No associated pain suggesting a diverticular bleed Alternatively this could simply be bleeding internal hemorrhoid considering the patient began to bleed shortly after inserting a suppository Hemodynamically stable, hemoglobin currently stable Performing serial CBCs Gentle intravenous hydration Obtaining gastroenterology consultation to decide on whether endoscopic work-up (after prep could possibly be done 6/22) versus CTA of the abdomen (with steroid prep) would be the next diagnostic step ER provider initiated an intravenous PPI although I think it is unlikely that this is upper in origin.  Essential hypertension Documented history of hypertension although there are no antihypertensives on the med list Currently normotensive PRN IV antihypertensives for elevated BP.   Uncontrolled type 2 diabetes mellitus with hyperglycemia, with long-term current use of insulin (HCC) Patient been placed on Accu-Cheks before every meal and nightly with sliding scale insulin Due to tenuous oral intake we will hold off on basal insulin therapy for now Holding home regimen of hypoglycemics Hemoglobin A1C ordered    Mixed diabetic hyperlipidemia associated with type 2 diabetes mellitus (HCC) Continuing home  regimen of lipid lowering therapy.   Moderate persistent asthma without complication No evidence of acute asthma exacerbation As needed bronchodilator therapy for shortness of breath and wheezing.        Code Status:  Full code  code status decision has been confirmed with: patient Family Communication: deferred   Consults: [facility]   Severity of Illness:  The appropriate patient status for this patient is OBSERVATION. Observation status is judged to be reasonable and necessary in order to provide the required intensity of  service to ensure the patient's safety. The patient's presenting symptoms, physical exam findings, and initial radiographic and laboratory data in the context of their medical condition is felt to place them at decreased risk for further clinical deterioration. Furthermore, it is anticipated that the patient will be medically stable for discharge from the hospital within 2 midnights of admission.   Author:  Marinda Elk MD  03/30/2022 9:59 AM

## 2022-03-30 NOTE — ED Provider Notes (Signed)
[facility]-EMERGENCY DEPT [facility] Emergency Department Provider Note MRN:  [id number]  Arrival date & time: 03/30/22     Chief Complaint   Rectal Bleeding   History of Present Illness   Susan Yates is a 66 y.o. year-old female presents to the ED with chief complaint of abdominal pain and bleeding.  Reports feeling bloated.  Reports having had several episodes of large bloody bowel movements.  Patient states the blood was partially clotted and not bright red.  She states she had been constipated and used a suppository yesterday.  She states she is concerned she has cancer.  History provided by patient.   Review of Systems  Pertinent review of systems noted in HPI.    Physical Exam   Vitals:   03/30/22 0435 03/30/22 0436  BP: 114/69 101/73  Pulse:    Resp:    Temp:    SpO2:      CONSTITUTIONAL:  well-appearing, NAD NEURO:  Alert and oriented x 3, CN 3-12 grossly intact EYES:  eyes equal and reactive ENT/NECK:  Supple, no stridor  CARDIO:  normal rate, regular rhythm, appears well-perfused  PULM:  No respiratory distress, CTAB GI/GU:  non-distended, no focal tenderness MSK/SPINE:  No gross deformities, no edema, moves all extremities  SKIN:  no rash, atraumatic   *Additional and/or pertinent findings included in MDM below  Diagnostic and Interventional Summary    EKG Interpretation  Date/Time:    Ventricular Rate:    PR Interval:    QRS Duration:   QT Interval:    QTC Calculation:   R Axis:     Text Interpretation:         Labs Reviewed  COMPREHENSIVE METABOLIC PANEL - Abnormal; Notable for the following components:      Result Value   Glucose, Bld 134 (*)    All other components within normal limits  CBC WITH DIFFERENTIAL/PLATELET - Abnormal; Notable for the following components:   RBC 5.42 (*)    All other components within normal limits  POC OCCULT BLOOD, ED - Abnormal; Notable for the following components:   Fecal Occult Bld POSITIVE (*)     All other components within normal limits  PROTIME-INR  TYPE AND SCREEN  ABO/RH    No orders to display    Medications  sodium chloride (PF) 0.9 % injection (has no administration in time range)  iohexol (OMNIPAQUE) 300 MG/ML solution 100 mL (has no administration in time range)  pantoprazole (PROTONIX) 80 mg /NS 100 mL IVPB (has no administration in time range)  pantoprozole (PROTONIX) 80 mg /NS 100 mL infusion (has no administration in time range)  pantoprazole (PROTONIX) injection 40 mg (has no administration in time range)     Procedures  /  Critical Care .Critical Care  Performed by: Roxy Horseman, PA-C Authorized by: Roxy Horseman, PA-C   Critical care provider statement:    Critical care time (minutes):  40   Critical care was necessary to treat or prevent imminent or life-threatening deterioration of the following conditions:  Circulatory failure   Critical care was time spent personally by me on the following activities:  Development of treatment plan with patient or surrogate, discussions with consultants, evaluation of patient's response to treatment, examination of patient, ordering and review of laboratory studies, ordering and review of radiographic studies, ordering and performing treatments and interventions, pulse oximetry, re-evaluation of patient's condition and review of old charts   ED Course and Medical Decision Making  I have reviewed the triage  vital signs, the nursing notes, and pertinent available records from the EMR.  Social Determinants Affecting Complexity of Care: Patient has no clinically significant social determinants affecting this chief complaint..   ED Course: Clinical Course as of 03/30/22 0511  Wed Mar 30, 2022  0312 [place] BM in bedside commode [RB]    Clinical Course User Index [RB] Roxy Horseman, PA-C   Patient here with GI bleed.  Top differential diagnoses include upper vs lower GI bleed, diverticulitis, cancer. Medical  Decision Making Amount and/or Complexity of Data Reviewed Labs: ordered.    Details: HGB is 14.7, no leukocytosis, no electrolyte derangement, normal BUN  Risk Prescription drug management. Decision regarding hospitalization.     Consultants: I discussed the case with Hospitalist, Dr. Leafy Half, who is appreciated for admitting. Secure chat sent to Dr. Bosie Clos, who replied and will have patient seen by GI hospital team.  Treatment and Plan: Patient's exam and diagnostic results are concerning for GI bleed.  Feel that patient will need admission to the hospital for further treatment and evaluation.    Final Clinical Impressions(s) / ED Diagnoses     ICD-10-CM   1. Gastrointestinal hemorrhage, unspecified gastrointestinal hemorrhage type  K92.2       ED Discharge Orders     None         Discharge Instructions Discussed with and Provided to Patient:   Discharge Instructions   None      Roxy Horseman, PA-C 03/30/22 0512    Molpus, Jonny Ruiz, MD 03/30/22 [phone]

## 2022-03-30 NOTE — Progress Notes (Signed)
[facility] PROGRESS NOTE   Susan Yates MGN:[id number] DOB: Aug 21, 1956 DOA: 03/30/2022  PCP: Heywood Bene, PA-C  Brief History/Interval Summary: 66 year old female with past medical history of hypertension, hyperlipidemia, insulin-dependent diabetes mellitus type 2 who presents to [facility] emergency department with complaints of bloody stool.  She was hospitalized for further management.  Consultants: Gastroenterology  Procedures: Plan is for colonoscopy tomorrow    Subjective/Interval History: Patient mentioned that she has had multiple bowel movements overnight with significant amount of blood in the stool.  Denies any dizziness or lightheadedness currently.  Some abdominal discomfort is present.  No nausea or vomiting.    Assessment/Plan:  Hematochezia No significant drop in hemoglobin noted.  Last colonoscopy was 4 to 5 years ago and revealed a single polyp per patient.  Report not available. Gastroenterology has been consulted.  Looks like plan is for colonoscopy tomorrow. Diffuse abdominal discomfort is present.  LFTs noted to be normal.   Will check abdominal x-ray.  Contrast allergy is noted. Continue to monitor hemoglobin  Essential hypertension Monitor blood pressures closely.  Diabetes mellitus type 2, uncontrolled with hyperglycemia Just SSI for now.  Hyperlipidemia Continue statin.  History of asthma Stable.  DVT Prophylaxis: SCDs Code Status: Full code Family Communication: Discussed with patient Disposition Plan: Anticipate discharge to home in 24 to 48 hours.  Status is: Observation The patient remains OBS appropriate and will d/c before 2 midnights.      Medications: Scheduled:  insulin aspart  0-15 Units Subcutaneous TID AC & HS   insulin glargine-yfgn  40 Units Subcutaneous QHS   [START ON 04/02/2022] pantoprazole  40 mg Intravenous Q12H   peg 3350 powder  1 kit Oral Once   [START ON 03/31/2022] peg 3350 powder   1 kit Oral Once   rosuvastatin  40 mg Oral Daily   sodium chloride flush  3 mL Intravenous Q12H   Continuous:  lactated ringers 100 mL/hr at 03/30/22 0649   BVQ:[id number] **OR** acetaminophen, albuterol, hydrALAZINE, ondansetron **OR** ondansetron (ZOFRAN) IV, polyethylene glycol  Antibiotics: Anti-infectives (From admission, onward)    None       Objective:  Vital Signs  Vitals:   03/30/22 1030 03/30/22 1100 03/30/22 1130 03/30/22 1200  BP: 116/68 111/73 115/67 111/68  Pulse: 77 78 74 71  Resp: '16 13 14 12  ' Temp:      TempSrc:      SpO2: 97% 98% 99% 98%  Weight:      Height:       No intake or output data in the 24 hours ending 03/30/22 1242 Filed Weights   03/30/22 0233  Weight: 68 kg    General appearance: Awake alert.  In no distress Resp: Clear to auscultation bilaterally.  Normal effort Cardio: S1-S2 is normal regular.  No S3-S4.  No rubs murmurs or bruit GI: Abdomen is soft.  Mildly tender diffusely without any rebound rigidity or guarding.  No masses or organomegaly. Extremities: No edema.  Full range of motion of lower extremities. Neurologic: Alert and oriented x3.  No focal neurological deficits.    Lab Results:  Data Reviewed: I have personally reviewed following labs and reports of the imaging studies  CBC: Recent Labs  Lab 03/30/22 0328 03/30/22 0817  WBC 9.8 7.7  NEUTROABS 6.4  --   HGB 14.7 12.4  HCT 45.8 38.4  MCV 84.5 84.2  PLT 296 491    Basic Metabolic Panel: Recent Labs  Lab 03/30/22 0328  NA 141  K 3.9  CL 106  CO2 26  GLUCOSE 134*  BUN 17  CREATININE 0.73  CALCIUM 9.4    GFR: Estimated Creatinine Clearance: 62.7 mL/min (by C-G formula based on SCr of 0.73 mg/dL).  Liver Function Tests: Recent Labs  Lab 03/30/22 0328  AST 20  ALT 23  ALKPHOS 52  BILITOT 0.3  PROT 7.7  ALBUMIN 4.4    Coagulation Profile: Recent Labs  Lab 03/30/22 0328  INR 0.9      HbA1C: Recent Labs    03/30/22 0328   HGBA1C 8.7*    CBG: Recent Labs  Lab 03/30/22 0806 03/30/22 1204  GLUCAP 111* 164*    Radiology Studies: No results found.     LOS: 0 days   Shamarie Call [employer] on [email or web address]  03/30/2022, 12:42 PM

## 2022-03-30 NOTE — Assessment & Plan Note (Signed)
?   No evidence of acute asthma exacerbation ?? As needed bronchodilator therapy for shortness of breath and wheezing. ? ?

## 2022-03-31 ENCOUNTER — Observation Stay (HOSPITAL_COMMUNITY): Payer: BLUE CROSS/BLUE SHIELD | Admitting: Anesthesiology

## 2022-03-31 ENCOUNTER — Encounter (HOSPITAL_COMMUNITY): Payer: Self-pay | Admitting: Internal Medicine

## 2022-03-31 ENCOUNTER — Encounter (HOSPITAL_COMMUNITY)
Admission: EM | Disposition: A | Discharge status: Home / Self Care | Payer: Self-pay | Source: Home / Self Care | Attending: Emergency Medicine

## 2022-03-31 DIAGNOSIS — K922 Gastrointestinal hemorrhage, unspecified: Secondary | ICD-10-CM | POA: Diagnosis not present

## 2022-03-31 HISTORY — PX: COLONOSCOPY: SHX5424

## 2022-03-31 LAB — COMPREHENSIVE METABOLIC PANEL
ALT: 18 U/L (ref 0–44)
AST: 16 U/L (ref 15–41)
Albumin: 3.2 g/dL — ABNORMAL LOW (ref 3.5–5.0)
Alkaline Phosphatase: 37 U/L — ABNORMAL LOW (ref 38–126)
Anion gap: 3 — ABNORMAL LOW (ref 5–15)
BUN: 10 mg/dL (ref 8–23)
CO2: 29 mmol/L (ref 22–32)
Calcium: 8.5 mg/dL — ABNORMAL LOW (ref 8.9–10.3)
Chloride: 113 mmol/L — ABNORMAL HIGH (ref 98–111)
Creatinine, Ser: 0.58 mg/dL (ref 0.44–1.00)
GFR, Estimated: >60 mL/min (ref >60)
Glucose, Bld: 78 mg/dL (ref 70–99)
Potassium: 3.7 mmol/L (ref 3.5–5.1)
Sodium: 145 mmol/L (ref 135–145)
Total Bilirubin: 0.9 mg/dL (ref 0.3–1.2)
Total Protein: 5.5 g/dL — ABNORMAL LOW (ref 6.5–8.1)

## 2022-03-31 LAB — CBC WITH DIFFERENTIAL/PLATELET
Abs Immature Granulocytes: 0.01 10*3/uL (ref 0.00–0.07)
Basophils Absolute: 0 10*3/uL (ref 0.0–0.1)
Basophils Relative: 1 %
Eosinophils Absolute: 0.2 10*3/uL (ref 0.0–0.5)
Eosinophils Relative: 4 %
HCT: 35.6 % — ABNORMAL LOW (ref 36.0–46.0)
Hemoglobin: 11.2 g/dL — ABNORMAL LOW (ref 12.0–15.0)
Immature Granulocytes: 0 %
Lymphocytes Relative: 51 %
Lymphs Abs: 2.6 10*3/uL (ref 0.7–4.0)
MCH: 26.9 pg (ref 26.0–34.0)
MCHC: 31.5 g/dL (ref 30.0–36.0)
MCV: 85.4 fL (ref 80.0–100.0)
Monocytes Absolute: 0.5 10*3/uL (ref 0.1–1.0)
Monocytes Relative: 9 %
Neutro Abs: 1.8 10*3/uL (ref 1.7–7.7)
Neutrophils Relative %: 35 %
Platelets: 239 10*3/uL (ref 150–400)
RBC: 4.17 MIL/uL (ref 3.87–5.11)
RDW: 13.5 % (ref 11.5–15.5)
WBC: 5.1 10*3/uL (ref 4.0–10.5)
nRBC: 0 % (ref 0.0–0.2)

## 2022-03-31 LAB — MAGNESIUM: Magnesium: 2.3 mg/dL (ref 1.7–2.4)

## 2022-03-31 LAB — GLUCOSE, CAPILLARY: Glucose-Capillary: 107 mg/dL — ABNORMAL HIGH (ref 70–99)

## 2022-03-31 SURGERY — COLONOSCOPY
Anesthesia: Monitor Anesthesia Care

## 2022-03-31 MED ORDER — LACTATED RINGERS IV SOLN: INTRAVENOUS | Status: DC

## 2022-03-31 MED ORDER — LIDOCAINE HCL 1 % IJ SOLN
INTRAMUSCULAR | Status: DC | PRN
  Administered 2022-03-31: 50 mg via INTRADERMAL

## 2022-03-31 MED ORDER — PROPOFOL 500 MG/50ML IV EMUL
INTRAVENOUS | Status: DC | PRN
  Administered 2022-03-31: 120 ug/kg/min via INTRAVENOUS

## 2022-03-31 MED ORDER — PROPOFOL 10 MG/ML IV BOLUS
INTRAVENOUS | Status: DC | PRN
  Administered 2022-03-31 (×2): 10 mg via INTRAVENOUS

## 2022-03-31 MED ORDER — SODIUM CHLORIDE 0.9 % IV SOLN: INTRAVENOUS | Status: DC

## 2022-03-31 MED ORDER — PROPOFOL 1000 MG/100ML IV EMUL
INTRAVENOUS | Status: AC
  Filled 2022-03-31: qty 100

## 2022-03-31 MED ORDER — POLYETHYLENE GLYCOL 3350 17 G PO PACK: 17.0000 g | PACK | Freq: Every day | ORAL | 0 refills | Status: AC

## 2022-03-31 NOTE — Progress Notes (Signed)
[facility] PROGRESS NOTE   Susan Yates [id number] DOB: 01/18/1956 DOA: 03/30/2022  PCP: Susan Kill, PA-C  Brief History/Interval Summary: 66 year old female with past medical history of hypertension, hyperlipidemia, insulin-dependent diabetes mellitus type 2 who presents to [facility] emergency department with complaints of bloody stool.  She was hospitalized for further management.  Consultants: Gastroenterology  Procedures: Colonoscopy today    Subjective/Interval History: Patient mentions that she is not passing as much blood in her stool as the day before.  Denies any abdominal pain nausea or vomiting.      Assessment/Plan:  Hematochezia Last colonoscopy was 4 to 5 years ago and revealed a single polyp per patient.  Report not available. Mild drop in hemoglobin noted.  Bleeding appears to be subsiding.  Etiology unclear. Gastroenterology has been consulted.  Plan is for colonoscopy today. Abdominal discomfort has improved.  Abdominal x-ray did not show any acute findings.  Acute blood loss anemia Some drop in hemoglobin is noted.  Not requiring transfusion at this time.  Continue to monitor.  Essential hypertension Blood pressure is reasonably well controlled.  Diabetes mellitus type 2, uncontrolled with hyperglycemia Just SSI for now.  Monitor CBGs  Hyperlipidemia Continue statin.  History of asthma Stable.  DVT Prophylaxis: SCDs Code Status: Full code Family Communication: Discussed with patient Disposition Plan: Possible discharge later today or tomorrow depending on colonoscopy findings and recovery.  Status is: Observation The patient remains OBS appropriate and will d/c before 2 midnights.      Medications: Scheduled:  insulin aspart  0-15 Units Subcutaneous TID AC & HS   pantoprazole  40 mg Intravenous QHS   rosuvastatin  40 mg Oral Daily   sodium chloride flush  3 mL Intravenous Q12H   Continuous:  sodium  chloride 20 mL/hr at 03/31/22 0426   PIR:[id number] **OR** acetaminophen, albuterol, hydrALAZINE, ondansetron **OR** ondansetron (ZOFRAN) IV, polyethylene glycol  Antibiotics: Anti-infectives (From admission, onward)    None       Objective:  Vital Signs  Vitals:   03/30/22 2317 03/31/22 0208 03/31/22 0532 03/31/22 0920  BP: 116/71 104/70 114/65 121/75  Pulse: 67 70 67 67  Resp: 16 20 12 16   Temp: 97.8 F (36.6 C) 98.2 F (36.8 C) (!) 97.5 F (36.4 C) 98.1 F (36.7 C)  TempSrc: Oral Oral Oral Oral  SpO2: 99% 97% 100% 100%  Weight:      Height:        Intake/Output Summary (Last 24 hours) at 03/31/2022 0932 Last data filed at 03/31/2022 0800 Gross per 24 hour  Intake 1915.41 ml  Output --  Net 1915.41 ml   Filed Weights   03/30/22 0233 03/30/22 1411  Weight: 68 kg 70 kg    General appearance: Awake alert.  In no distress Resp: Clear to auscultation bilaterally.  Normal effort Cardio: S1-S2 is normal regular.  No S3-S4.  No rubs murmurs or bruit GI: Abdomen is soft.  Nontender nondistended.  Bowel sounds are present normal.  No masses organomegaly Extremities: No edema.  Full range of motion of lower extremities. Neurologic: Alert and oriented x3.  No focal neurological deficits.     Lab Results:  Data Reviewed: I have personally reviewed following labs and reports of the imaging studies  CBC: Recent Labs  Lab 03/30/22 0328 03/30/22 0817 03/30/22 1423 03/30/22 2111 03/31/22 0436  WBC 9.8 7.7 6.7 6.5 5.1  NEUTROABS 6.4  --   --   --  1.8  HGB 14.7 12.4 12.0  11.4* 11.2*  HCT 45.8 38.4 37.7 35.7* 35.6*  MCV 84.5 84.2 84.9 85.0 85.4  PLT 296 283 262 235 239     Basic Metabolic Panel: Recent Labs  Lab 03/30/22 0328 03/31/22 0436  NA 141 145  K 3.9 3.7  CL 106 113*  CO2 26 29  GLUCOSE 134* 78  BUN 17 10  CREATININE 0.73 0.58  CALCIUM 9.4 8.5*  MG  --  2.3     GFR: Estimated Creatinine Clearance: 63.6 mL/min (by C-G formula based on  SCr of 0.58 mg/dL).  Liver Function Tests: Recent Labs  Lab 03/30/22 0328 03/31/22 0436  AST 20 16  ALT 23 18  ALKPHOS 52 37*  BILITOT 0.3 0.9  PROT 7.7 5.5*  ALBUMIN 4.4 3.2*     Coagulation Profile: Recent Labs  Lab 03/30/22 0328  INR 0.9       HbA1C: Recent Labs    03/30/22 0328  HGBA1C 8.7*     CBG: Recent Labs  Lab 03/30/22 1204 03/30/22 1750 03/30/22 1818 03/30/22 2117 03/31/22 0744  GLUCAP 164* 65* 121* 168* 107*     Radiology Studies: DG Abd Portable 2V  Result Date: 03/30/2022 CLINICAL DATA:  Rectal bleeding. EXAM: PORTABLE ABDOMEN - 2 VIEW COMPARISON:  None Available. FINDINGS: No abnormal bowel dilatation is noted. Large amount of stool is seen throughout the colon. There is no evidence of free air. No radio-opaque calculi or other significant radiographic abnormality is seen. IMPRESSION: Large stool burden.  No abnormal bowel dilatation. Electronically Signed   By: Lupita Raider M.D.   On: 03/30/2022 13:16       LOS: 0 days   [name] Rito Ehrlich  [facility] Pager on [email or web address]  03/31/2022, 9:32 AM

## 2022-03-31 NOTE — Transfer of Care (Signed)
Immediate Anesthesia Transfer of Care Note  Patient: Susan Yates  Procedure(s) Performed: COLONOSCOPY  Patient Location: PACU and Endoscopy Unit  Anesthesia Type:MAC  Level of Consciousness: awake, alert , oriented and patient cooperative  Airway & Oxygen Therapy: Patient Spontanous Breathing and Patient connected to face mask oxygen  Post-op Assessment: Report given to RN and Post -op Vital signs reviewed and stable  Post vital signs: Reviewed and stable  Last Vitals:  Vitals Value Taken Time  BP 114/65 03/31/22 1253  Temp 36.5 C 03/31/22 1253  Pulse 74 03/31/22 1255  Resp 16 03/31/22 1255  SpO2 100 % 03/31/22 1255  Vitals shown include unvalidated device data.  Last Pain:  Vitals:   03/31/22 1253  TempSrc: Tympanic  PainSc: Asleep         Complications: No notable events documented.

## 2022-03-31 NOTE — Op Note (Signed)
[facility] Patient Name: Susan Yates Procedure Date: 03/31/2022 MRN: [id number] Attending MD: Arta Silence , MD Date of Birth: [birth year] CSN: [id number] Age: 66 Admit Type: Inpatient Procedure:                Colonoscopy Indications:              Hematochezia Providers:                Arta Silence, MD, Jaci Carrel, RN, Cherylynn Ridges, Technician, Karis Juba, CRNA Referring MD:             [facility] Medicines:                Monitored Anesthesia Care Complications:            No immediate complications. Estimated Blood Loss:     Estimated blood loss: none. Procedure:                Pre-Anesthesia Assessment:                           - Prior to the procedure, a History and Physical                            was performed, and patient medications and                            allergies were reviewed. The patient's tolerance of                            previous anesthesia was also reviewed. The risks                            and benefits of the procedure and the sedation                            options and risks were discussed with the patient.                            All questions were answered, and informed consent                            was obtained. Prior Anticoagulants: The patient has                            taken no previous anticoagulant or antiplatelet                            agents. ASA Grade Assessment: II - A patient with                            mild systemic disease. After reviewing the risks  and benefits, the patient was deemed in                            satisfactory condition to undergo the procedure.                           After obtaining informed consent, the colonoscope                            was passed under direct vision. Throughout the                            procedure, the patient's blood pressure, pulse, and                             oxygen saturations were monitored continuously. The                            [id number] Olympus colonoscope was                            introduced through the anus and advanced to the the                            terminal ileum, with identification of the                            appendiceal orifice and IC valve. The terminal                            ileum, ileocecal valve, appendiceal orifice, and                            rectum were photographed. The entire colon was                            examined. The colonoscopy was performed without                            difficulty. The patient tolerated the procedure                            well. The quality of the bowel preparation was fair. Scope In: 12:24:53 PM Scope Out: 12:44:49 PM Scope Withdrawal Time: 0 hours 12 minutes 11 seconds  Total Procedure Duration: 0 hours 19 minutes 56 seconds  Findings:      The perianal and digital rectal examinations were normal.      Internal hemorrhoids were found during retroflexion. The hemorrhoids       were mild.      No additional abnormalities were found on retroflexion.      A few medium-mouthed diverticula were found in the sigmoid colon and       descending colon.      Bowel prep diffusely fair; semisolid and viscous stool obscured some       views throughout  colon; diminutive or subtle sessile polyps could easily       have been missed.      The terminal ileum appeared normal.      No old or fresh blood was seen to the extent of our examination.      The exam was otherwise without abnormality on direct and retroflexion       views. Impression:               - Preparation of the colon was fair.                           - Internal hemorrhoids.                           - Diverticulosis in the sigmoid colon and in the                            descending colon.                           - The examined portion of the ileum was normal.                            - The examination was otherwise normal on direct                            and retroflexion views.                           - The examination was otherwise normal. Suspect                            prior bleeding was from diverticulosis and has                            resolved. Moderate Sedation:      Not Applicable - Patient had care per Anesthesia. Recommendation:           - Return patient to hospital ward for ongoing care.                           - Soft diet today.                           - Continue present medications.                           - Repeat colonoscopy in 5 years for surveillance.                           [facility] will sign-off; outpatient follow-up with                            [place] as-needed; please call with any questions; thank  you for the consultation. Procedure Code(s):        --- Professional ---                           [phone], Colonoscopy, flexible; diagnostic, including                            collection of specimen(s) by brushing or washing,                            when performed (separate procedure) Diagnosis Code(s):        --- Professional ---                           K64.8, Other hemorrhoids                           K92.1, Melena (includes Hematochezia)                           K57.30, Diverticulosis of large intestine without                            perforation or abscess without bleeding CPT copyright 2019 [employer]. All rights reserved. The codes documented in this report are preliminary and upon coder review may  be revised to meet current compliance requirements. Susan Modena, MD 03/31/2022 12:49:23 PM This report has been signed electronically. Number of Addenda: 0

## 2022-03-31 NOTE — Progress Notes (Signed)
Transition of Care [facility]) Screening Note  Patient Details  Name: Jaquetta Currier Date of Birth: April 22, 1956  Transition of Care [facility] CM/SW Contact:    Ewing Schlein, LCSW Phone Number: 03/31/2022, 10:47 AM  Transition of Care Department [facility]) has reviewed patient and no TOC needs have been identified at this time. We will continue to monitor patient advancement through interdisciplinary progression rounds. If new patient transition needs arise, please place a TOC consult.

## 2022-03-31 NOTE — Discharge Instructions (Signed)

## 2022-03-31 NOTE — Anesthesia Preprocedure Evaluation (Signed)
Anesthesia Evaluation  Patient identified by MRN, date of birth, ID band Patient awake    Reviewed: Allergy & Precautions, NPO status , Patient's Chart, lab work & pertinent test results  Airway Mallampati: II  TM Distance: >3 FB Neck ROM: Full    Dental  (+) Teeth Intact, Dental Advisory Given   Pulmonary asthma , former smoker,    Pulmonary exam normal breath sounds clear to auscultation       Cardiovascular hypertension, negative cardio ROS Normal cardiovascular exam Rhythm:Regular Rate:Normal     Neuro/Psych negative neurological ROS     GI/Hepatic Neg liver ROS, Hematochezia   Endo/Other  diabetes, Poorly Controlled, Type 2, Oral Hypoglycemic Agents, Insulin Dependent  Renal/GU negative Renal ROS     Musculoskeletal negative musculoskeletal ROS (+)   Abdominal   Peds  Hematology negative hematology ROS (+)   Anesthesia Other Findings Day of surgery medications reviewed with the patient.  Reproductive/Obstetrics                             Anesthesia Physical Anesthesia Plan  ASA: 2  Anesthesia Plan: MAC   Post-op Pain Management:    Induction: Intravenous  PONV Risk Score and Plan: 2 and TIVA and Treatment may vary due to age or medical condition  Airway Management Planned: Nasal Cannula and Natural Airway  Additional Equipment:   Intra-op Plan:   Post-operative Plan:   Informed Consent: I have reviewed the patients History and Physical, chart, labs and discussed the procedure including the risks, benefits and alternatives for the proposed anesthesia with the patient or authorized representative who has indicated his/her understanding and acceptance.     Dental advisory given  Plan Discussed with: CRNA and Anesthesiologist  Anesthesia Plan Comments:         Anesthesia Quick Evaluation

## 2022-03-31 NOTE — Progress Notes (Signed)
Provided discharge instructions. Pt verbalized understanding, and she has no further questions. Removed PIV. Pt belongings returned. Pt's daughter requested follow from GI. Dr. Dulce Sellar notified and aware.

## 2022-03-31 NOTE — Interval H&P Note (Signed)
History and Physical Interval Note:  03/31/2022 11:59 AM  Susan Yates  has presented today for surgery, with the diagnosis of Hematochezia.  The various methods of treatment have been discussed with the patient and family. After consideration of risks, benefits and other options for treatment, the patient has consented to  Procedure(s): COLONOSCOPY (N/A) as a surgical intervention.  The patient's history has been reviewed, patient examined, no change in status, stable for surgery.  I have reviewed the patient's chart and labs.  Questions were answered to the patient's satisfaction.     Freddy Jaksch

## 2022-03-31 NOTE — Discharge Summary (Incomplete)
[facility]  Physician Discharge Summary   Patient ID: Susan Yates MRN: [id number] DOB/AGE: [birth year] 66 y.o.  Admit date: 03/30/2022 Discharge date:   ***  PCP: Roger Kill, PA-C  DISCHARGE DIAGNOSES:  Principal Problem:   Acute GI bleeding Active Problems:   Essential hypertension   Uncontrolled type 2 diabetes mellitus with hyperglycemia, with long-term current use of insulin (HCC)   Mixed diabetic hyperlipidemia associated with type 2 diabetes mellitus (HCC)   Moderate persistent asthma without complication   RECOMMENDATIONS FOR OUTPATIENT FOLLOW UP: *** (include homehealth, outpatient follow-up instructions, specific recommendations for PCP to follow-up on, etc.)   Home Health:***  Equipment/Devices:***   CODE STATUS:***   DISCHARGE CONDITION: {condition:18240}  Diet recommendation: ***  INITIAL HISTORY: ***  Consultations: ***  Procedures: *** (i.e. Studies not automatically included, echos, thoracentesis, etc; not x-rays)  HOSPITAL COURSE: ***         Estimated body mass index is 29.16 kg/m as calculated from the following:   Height as of this encounter: 5\' 1"  (1.549 m).   Weight as of this encounter: 70 kg.              PERTINENT LABS:  The results of significant diagnostics from this hospitalization (including imaging, microbiology, ancillary and laboratory) are listed below for reference.    Microbiology: No results found for this or any previous visit (from the past 240 hour(s)).   Labs:   Basic Metabolic Panel: Recent Labs  Lab 03/30/22 0328 03/31/22 0436  NA 141 145  K 3.9 3.7  CL 106 113*  CO2 26 29  GLUCOSE 134* 78  BUN 17 10  CREATININE 0.73 0.58  CALCIUM 9.4 8.5*  MG  --  2.3   Liver Function Tests: Recent Labs  Lab 03/30/22 0328 03/31/22 0436  AST 20 16  ALT 23 18  ALKPHOS 52 37*  BILITOT 0.3 0.9  PROT 7.7 5.5*  ALBUMIN 4.4 3.2*   No results for input(s): "LIPASE", "AMYLASE"  in the last 168 hours. No results for input(s): "AMMONIA" in the last 168 hours. CBC: Recent Labs  Lab 03/30/22 0328 03/30/22 0817 03/30/22 1423 03/30/22 2111 03/31/22 0436  WBC 9.8 7.7 6.7 6.5 5.1  NEUTROABS 6.4  --   --   --  1.8  HGB 14.7 12.4 12.0 11.4* 11.2*  HCT 45.8 38.4 37.7 35.7* 35.6*  MCV 84.5 84.2 84.9 85.0 85.4  PLT 296 283 262 235 239   Cardiac Enzymes: No results for input(s): "CKTOTAL", "CKMB", "CKMBINDEX", "TROPONINI" in the last 168 hours. BNP: BNP (last 3 results) No results for input(s): "BNP" in the last 8760 hours.  ProBNP (last 3 results) No results for input(s): "PROBNP" in the last 8760 hours.  CBG: Recent Labs  Lab 03/30/22 1204 03/30/22 1750 03/30/22 1818 03/30/22 2117 03/31/22 0744  GLUCAP 164* 65* 121* 168* 107*     IMAGING STUDIES DG Abd Portable 2V  Result Date: 03/30/2022 CLINICAL DATA:  Rectal bleeding. EXAM: PORTABLE ABDOMEN - 2 VIEW COMPARISON:  None Available. FINDINGS: No abnormal bowel dilatation is noted. Large amount of stool is seen throughout the colon. There is no evidence of free air. No radio-opaque calculi or other significant radiographic abnormality is seen. IMPRESSION: Large stool burden.  No abnormal bowel dilatation. Electronically Signed   By: 04/01/2022 M.D.   On: 03/30/2022 13:16    DISCHARGE EXAMINATION: Vitals:   03/31/22 0920 03/31/22 1010 03/31/22 1100 03/31/22 1253  BP: 121/75  124/61 114/65  Pulse:  67  69 70  Resp: 16  11 12   Temp: 98.1 F (36.7 C)  97.8 F (36.6 C) 97.7 F (36.5 C)  TempSrc: Oral  Temporal Tympanic  SpO2: 100% 97% 100% 100%  Weight:   70 kg   Height:   5\' 1"  (1.549 m)    {physical  DISPOSITION: ***  Discharge Instructions     Call MD for:  difficulty breathing, headache or visual disturbances   Complete by: As directed    Call MD for:  extreme fatigue   Complete by: As directed    Call MD for:  persistant dizziness or light-headedness   Complete by: As  directed    Call MD for:  persistant nausea and vomiting   Complete by: As directed    Call MD for:  severe uncontrolled pain   Complete by: As directed    Call MD for:  temperature >100.4   Complete by: As directed    Diet - low sodium heart healthy   Complete by: As directed    Discharge instructions   Complete by: As directed    Please take your medications as prescribed. Seek attention if symptoms recur. Avoid constipation.  You were cared for by a hospitalist during your hospital stay. If you have any questions about your discharge medications or the care you received while you were in the hospital after you are discharged, you can call the unit and asked to speak with the hospitalist on call if the hospitalist that took care of you is not available. Once you are discharged, your primary care physician will handle any further medical issues. Please note that NO REFILLS for any discharge medications will be authorized once you are discharged, as it is imperative that you return to your primary care physician (or establish a relationship with a primary care physician if you do not have one) for your aftercare needs so that they can reassess your need for medications and monitor your lab values. If you do not have a primary care physician, you can call [phone] for a physician referral.   Increase activity slowly   Complete by: As directed         Current Inpatient Medications: {medication reviewed/display:3041432}  Allergies as of 03/31/2022       Reactions   Iohexol Hives, Itching   Developed hives after getting contrast for a CT scan done in [phone] 4-5 yrs ago, gets pre-medicated before scans since then        Medication List     TAKE these medications    Farxiga 10 MG Tabs tablet Generic drug: dapagliflozin propanediol Take 10 mg by mouth daily.   glipiZIDE 10 MG tablet Commonly known as: GLUCOTROL Take 10 mg by mouth at bedtime.   insulin aspart 100 UNIT/ML  injection Commonly known as: novoLOG Inject 10 Units into the skin 3 (three) times daily before meals.   Ozempic (2 MG/DOSE) 8 MG/3ML Sopn Generic drug: Semaglutide (2 MG/DOSE) Inject 2 mg into the skin once a week.   polyethylene glycol 17 g packet Commonly known as: MIRALAX / GLYCOLAX Take 17 g by mouth daily.   rosuvastatin 40 MG tablet Commonly known as: CRESTOR Take 40 mg by mouth daily.   terbinafine 250 MG tablet Commonly known as: LAMISIL Take 250 mg by mouth daily.   TRESIBA FLEXTOUCH [place] Inject 40 Units into the skin at bedtime.   Ventolin HFA 108 (90 Base) MCG/ACT inhaler Generic drug: albuterol Inhale 2 puffs into  the lungs every 6 (six) hours as needed for shortness of breath or wheezing.   Vitamin D (Ergocalciferol) 1.25 MG (50000 UNIT) Caps capsule Commonly known as: DRISDOL Take 50,000 Units by mouth once a week.          Follow-up Information     Roger Kill, PA-C. Schedule an appointment as soon as possible for a visit in 1 week(s).   Specialty: Physician Assistant Contact information: [address] [place] [address] [place] [address] [phone]                 TOTAL DISCHARGE TIME: ***  Osvaldo Shipper  [facility] Pager on [email or web address]  03/31/2022, 1:01 PM

## 2022-07-05 IMAGING — CR DG ABD PORTABLE 2V
3 series · 3 of 3 positions shown · non-contrast
Comparison: None Available.

CLINICAL DATA: Rectal bleeding.

EXAM:
PORTABLE ABDOMEN - 2 VIEW

[w abdomen upright]
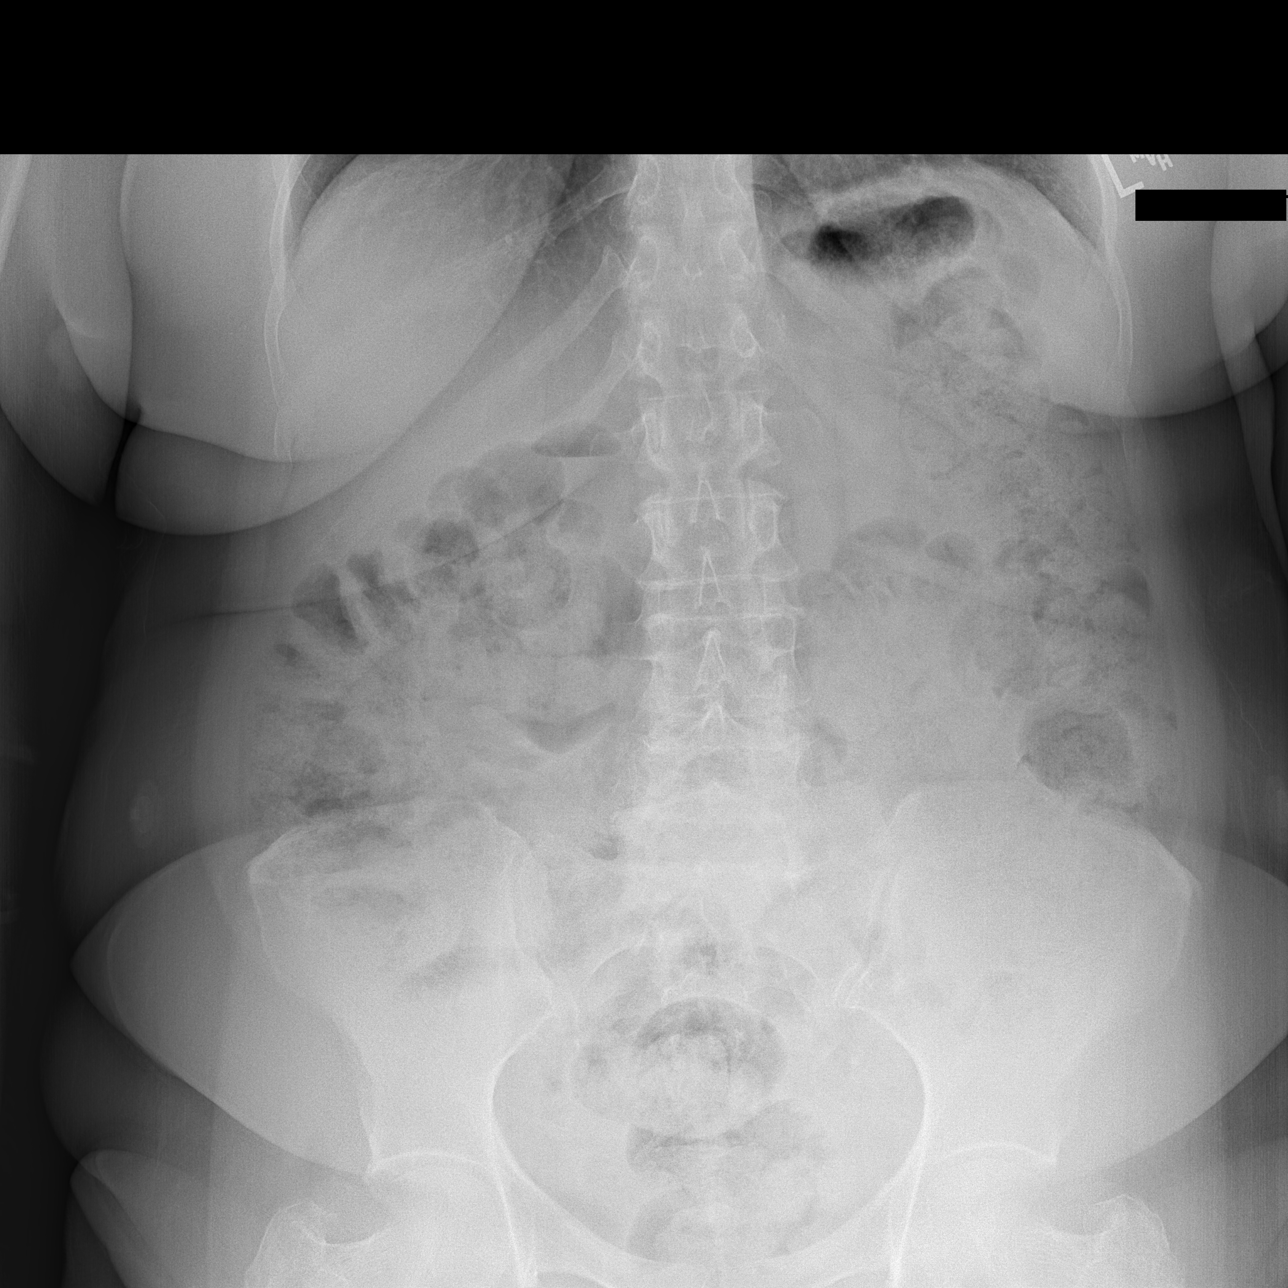

[t abdomen supine (1 of 2)]
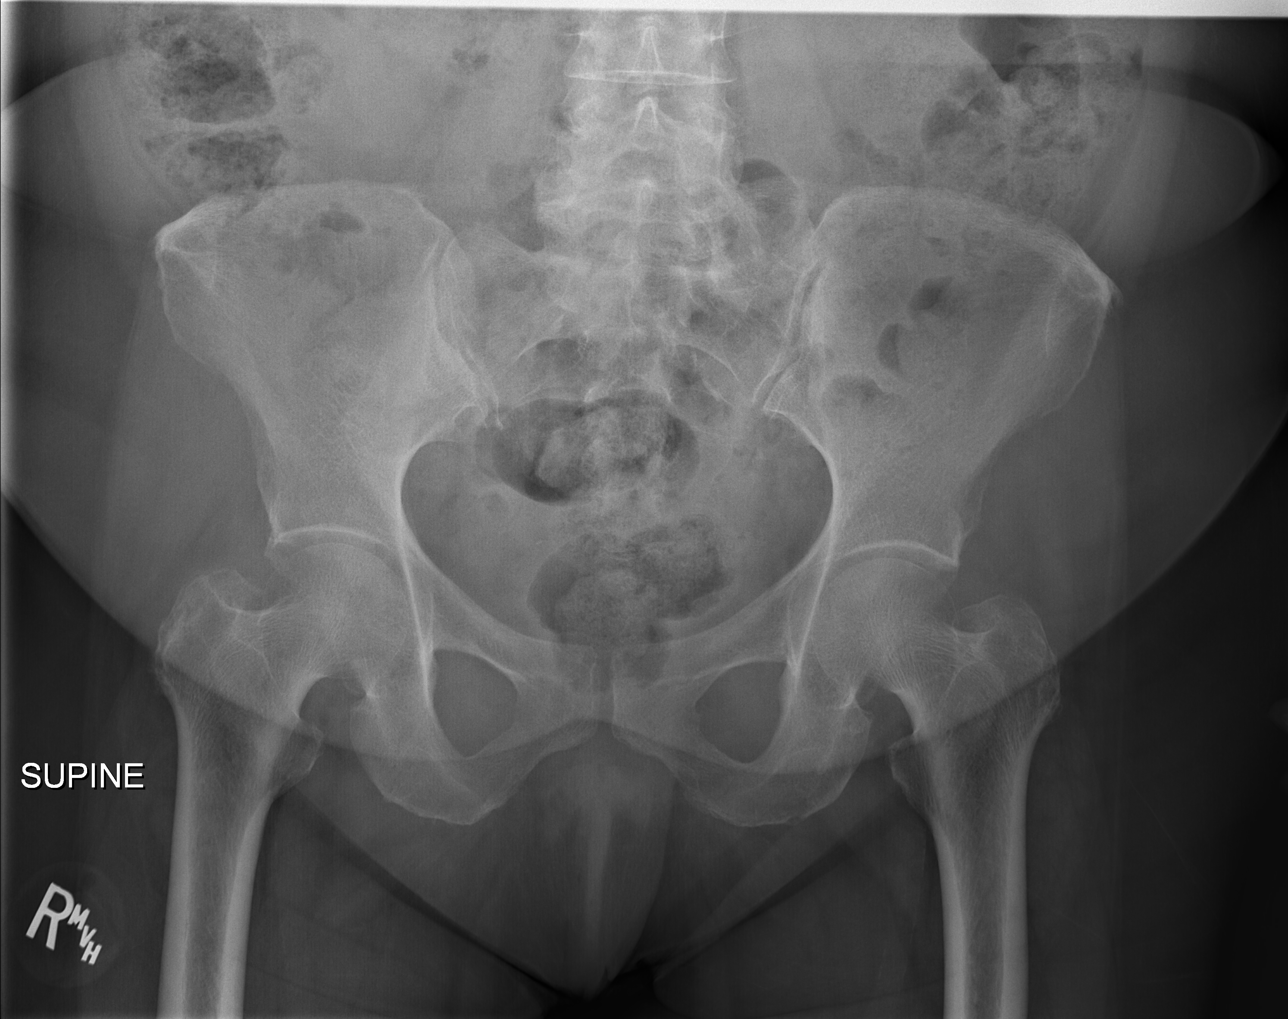

[t abdomen supine (2 of 2)]
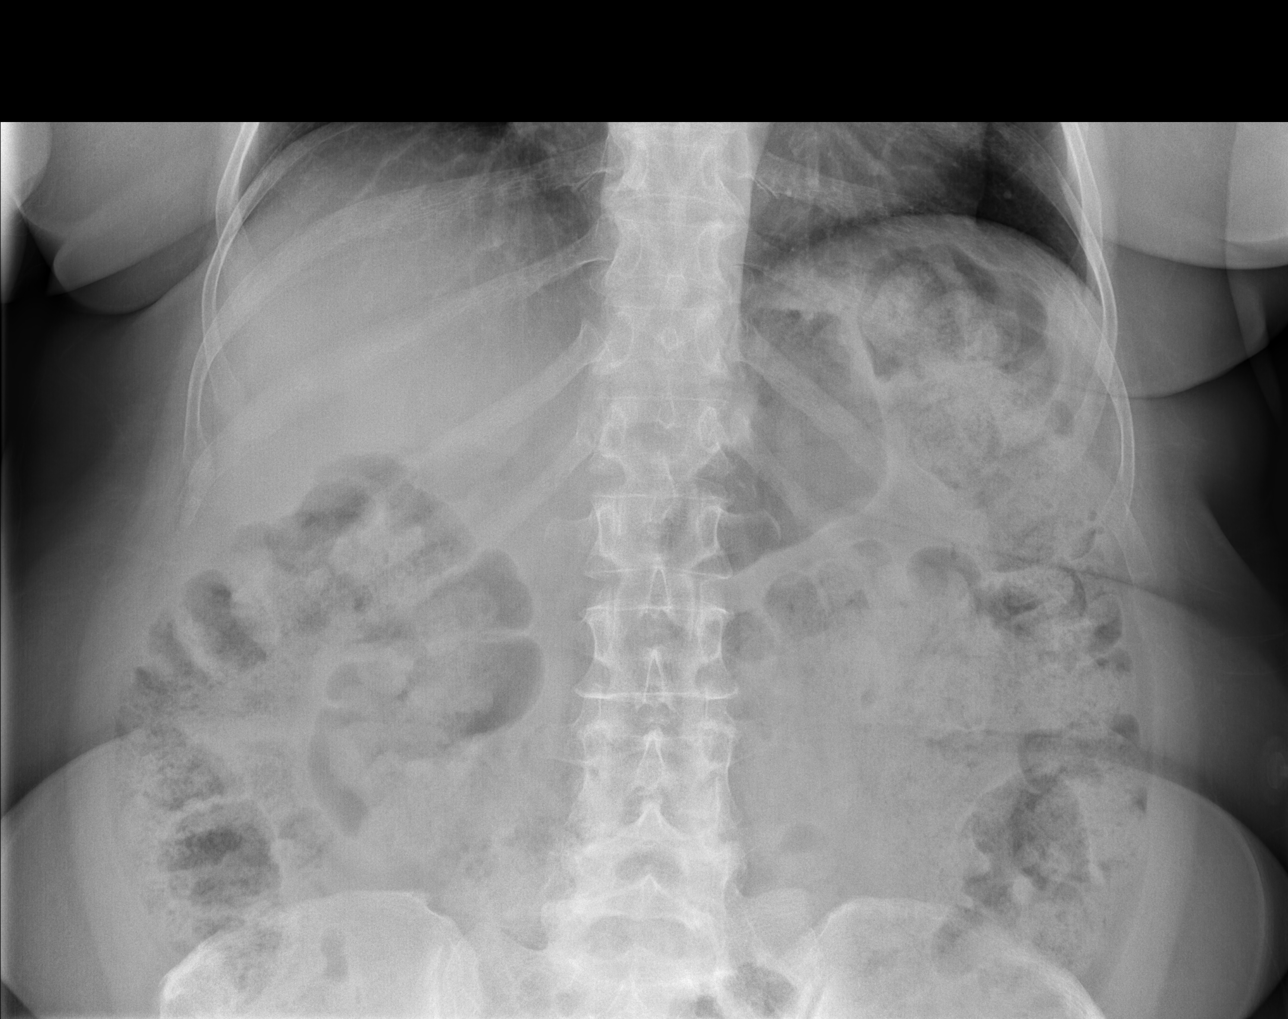

[3 of 3 positions shown; findings below may reference images not displayed]

FINDINGS: No abnormal bowel dilatation is noted. Large amount of stool is seen
throughout the colon. There is no evidence of free air. No
radio-opaque calculi or other significant radiographic abnormality
is seen.
IMPRESSION: Large stool burden.  No abnormal bowel dilatation.
